# Patient Record
Sex: Male | Born: 1970 | Race: White | Hispanic: No | Marital: Married | Smoking: Never smoker
Health system: Southern US, Community
[De-identification: ages and names within clinical notes are randomized; demographics above are authoritative.]

## PROBLEM LIST (undated history)

## (undated) DIAGNOSIS — S86011A Strain of right Achilles tendon, initial encounter: Secondary | ICD-10-CM

## (undated) DIAGNOSIS — I10 Essential (primary) hypertension: Secondary | ICD-10-CM

## (undated) DIAGNOSIS — J302 Other seasonal allergic rhinitis: Secondary | ICD-10-CM

## (undated) HISTORY — PX: WISDOM TOOTH EXTRACTION: SHX21

---

## 2005-11-21 ENCOUNTER — Encounter: Admission: RE | Admit: 2005-11-21 | Discharge: 2005-11-21 | Payer: Self-pay | Admitting: Occupational Medicine

## 2011-02-05 ENCOUNTER — Other Ambulatory Visit (HOSPITAL_COMMUNITY): Payer: Self-pay | Admitting: Internal Medicine

## 2011-02-05 ENCOUNTER — Ambulatory Visit (HOSPITAL_COMMUNITY)
Admission: RE | Admit: 2011-02-05 | Discharge: 2011-02-05 | Disposition: A | Discharge status: Home / Self Care | Payer: BC Managed Care – PPO | Source: Ambulatory Visit | Attending: Internal Medicine | Admitting: Internal Medicine

## 2011-02-05 DIAGNOSIS — R1909 Other intra-abdominal and pelvic swelling, mass and lump: Secondary | ICD-10-CM

## 2011-02-05 DIAGNOSIS — N509 Disorder of male genital organs, unspecified: Secondary | ICD-10-CM | POA: Insufficient documentation

## 2011-02-05 DIAGNOSIS — N432 Other hydrocele: Secondary | ICD-10-CM | POA: Insufficient documentation

## 2011-02-19 ENCOUNTER — Ambulatory Visit (INDEPENDENT_AMBULATORY_CARE_PROVIDER_SITE_OTHER): Payer: BC Managed Care – PPO | Admitting: Urology

## 2011-02-19 DIAGNOSIS — N4340 Spermatocele of epididymis, unspecified: Secondary | ICD-10-CM

## 2011-03-05 ENCOUNTER — Ambulatory Visit (INDEPENDENT_AMBULATORY_CARE_PROVIDER_SITE_OTHER): Payer: BC Managed Care – PPO | Admitting: Urology

## 2011-03-05 DIAGNOSIS — N4340 Spermatocele of epididymis, unspecified: Secondary | ICD-10-CM

## 2013-07-21 ENCOUNTER — Other Ambulatory Visit (HOSPITAL_COMMUNITY): Payer: Self-pay | Admitting: Family Medicine

## 2013-07-21 ENCOUNTER — Ambulatory Visit (HOSPITAL_COMMUNITY)
Admission: RE | Admit: 2013-07-21 | Discharge: 2013-07-21 | Disposition: A | Discharge status: Home / Self Care | Payer: BC Managed Care – PPO | Source: Ambulatory Visit | Attending: Family Medicine | Admitting: Family Medicine

## 2013-07-21 DIAGNOSIS — R0989 Other specified symptoms and signs involving the circulatory and respiratory systems: Secondary | ICD-10-CM | POA: Insufficient documentation

## 2013-07-21 DIAGNOSIS — R059 Cough, unspecified: Secondary | ICD-10-CM | POA: Insufficient documentation

## 2013-07-21 DIAGNOSIS — R062 Wheezing: Secondary | ICD-10-CM | POA: Insufficient documentation

## 2013-07-21 DIAGNOSIS — R05 Cough: Secondary | ICD-10-CM

## 2014-09-22 ENCOUNTER — Encounter (HOSPITAL_BASED_OUTPATIENT_CLINIC_OR_DEPARTMENT_OTHER): Payer: Self-pay | Admitting: *Deleted

## 2014-09-22 NOTE — Progress Notes (Signed)
   09/22/14 1046  OBSTRUCTIVE SLEEP APNEA  Have you ever been diagnosed with sleep apnea through a sleep study? No  Do you snore loudly (loud enough to be heard through closed doors)?  1  Do you often feel tired, fatigued, or sleepy during the daytime? 0  Has anyone observed you stop breathing during your sleep? 0  Do you have, or are you being treated for high blood pressure? 1  BMI more than 35 kg/m2? 0  Age over 44 years old? 0  Neck circumference greater than 40 cm/16 inches? 1  Gender: 1  Obstructive Sleep Apnea Score 4  Score 4 or greater  Results sent to PCP

## 2014-09-22 NOTE — Progress Notes (Signed)
He was sent for stress test 7/15-ekg done-said it was fine-called for notes Will need istat-lives [place]

## 2014-09-27 ENCOUNTER — Ambulatory Visit (HOSPITAL_BASED_OUTPATIENT_CLINIC_OR_DEPARTMENT_OTHER): Payer: BLUE CROSS/BLUE SHIELD | Admitting: Anesthesiology

## 2014-09-27 ENCOUNTER — Encounter (HOSPITAL_BASED_OUTPATIENT_CLINIC_OR_DEPARTMENT_OTHER): Payer: Self-pay | Admitting: *Deleted

## 2014-09-27 ENCOUNTER — Ambulatory Visit (HOSPITAL_BASED_OUTPATIENT_CLINIC_OR_DEPARTMENT_OTHER)
Admission: RE | Admit: 2014-09-27 | Discharge: 2014-09-27 | Disposition: A | Discharge status: Home / Self Care | Payer: BLUE CROSS/BLUE SHIELD | Source: Ambulatory Visit | Attending: Orthopedic Surgery | Admitting: Orthopedic Surgery

## 2014-09-27 ENCOUNTER — Encounter (HOSPITAL_BASED_OUTPATIENT_CLINIC_OR_DEPARTMENT_OTHER)
Admission: RE | Disposition: A | Discharge status: Home / Self Care | Payer: Self-pay | Source: Ambulatory Visit | Attending: Orthopedic Surgery

## 2014-09-27 DIAGNOSIS — Y9367 Activity, basketball: Secondary | ICD-10-CM | POA: Insufficient documentation

## 2014-09-27 DIAGNOSIS — I10 Essential (primary) hypertension: Secondary | ICD-10-CM | POA: Insufficient documentation

## 2014-09-27 DIAGNOSIS — J302 Other seasonal allergic rhinitis: Secondary | ICD-10-CM | POA: Insufficient documentation

## 2014-09-27 DIAGNOSIS — S86011A Strain of right Achilles tendon, initial encounter: Secondary | ICD-10-CM | POA: Diagnosis present

## 2014-09-27 HISTORY — DX: Essential (primary) hypertension: I10

## 2014-09-27 HISTORY — DX: Strain of right Achilles tendon, initial encounter: S86.011A

## 2014-09-27 HISTORY — PX: ACHILLES TENDON SURGERY: SHX542

## 2014-09-27 HISTORY — DX: Other seasonal allergic rhinitis: J30.2

## 2014-09-27 LAB — POCT I-STAT, CHEM 8
BUN: 14 mg/dL (ref 6–23)
Calcium, Ion: 1.22 mmol/L (ref 1.12–1.23)
Chloride: 103 mEq/L (ref 96–112)
Creatinine, Ser: 0.8 mg/dL (ref 0.50–1.35)
Glucose, Bld: 122 mg/dL — ABNORMAL HIGH (ref 70–99)
HEMATOCRIT: 49 % (ref 39.0–52.0)
Hemoglobin: 16.7 g/dL (ref 13.0–17.0)
Potassium: 4 mmol/L (ref 3.5–5.1)
Sodium: 140 mmol/L (ref 135–145)
TCO2: 22 mmol/L (ref 0–100)

## 2014-09-27 SURGERY — REPAIR, TENDON, ACHILLES
Anesthesia: General | Laterality: Right

## 2014-09-27 MED ORDER — HYDROMORPHONE HCL 1 MG/ML IJ SOLN
0.2500 mg | INTRAMUSCULAR | Status: DC | PRN
  Administered 2014-09-27 (×2): 0.5 mg via INTRAVENOUS

## 2014-09-27 MED ORDER — OXYCODONE HCL 5 MG PO TABS
ORAL_TABLET | ORAL | Status: AC
  Filled 2014-09-27: qty 1

## 2014-09-27 MED ORDER — FENTANYL CITRATE 0.05 MG/ML IJ SOLN
INTRAMUSCULAR | Status: AC
  Filled 2014-09-27: qty 4

## 2014-09-27 MED ORDER — BUPIVACAINE HCL (PF) 0.5 % IJ SOLN
INTRAMUSCULAR | Status: AC
  Filled 2014-09-27: qty 30

## 2014-09-27 MED ORDER — BACLOFEN 10 MG PO TABS: 10.0000 mg | ORAL_TABLET | Freq: Three times a day (TID) | ORAL | Status: DC

## 2014-09-27 MED ORDER — SENNA-DOCUSATE SODIUM 8.6-50 MG PO TABS: 2.0000 | ORAL_TABLET | Freq: Every day | ORAL | Status: DC

## 2014-09-27 MED ORDER — ONDANSETRON HCL 4 MG/2ML IJ SOLN
INTRAMUSCULAR | Status: DC | PRN
  Administered 2014-09-27: 4 mg via INTRAVENOUS

## 2014-09-27 MED ORDER — BUPIVACAINE-EPINEPHRINE (PF) 0.5% -1:200000 IJ SOLN
INTRAMUSCULAR | Status: DC | PRN
  Administered 2014-09-27: 25 mL via PERINEURAL

## 2014-09-27 MED ORDER — FENTANYL CITRATE 0.05 MG/ML IJ SOLN
INTRAMUSCULAR | Status: AC
  Filled 2014-09-27: qty 2

## 2014-09-27 MED ORDER — LACTATED RINGERS IV SOLN
INTRAVENOUS | Status: DC
  Administered 2014-09-27 (×2): via INTRAVENOUS

## 2014-09-27 MED ORDER — ONDANSETRON HCL 4 MG PO TABS: 4.0000 mg | ORAL_TABLET | Freq: Three times a day (TID) | ORAL | Status: DC | PRN

## 2014-09-27 MED ORDER — HYDROMORPHONE HCL 1 MG/ML IJ SOLN
INTRAMUSCULAR | Status: AC
  Filled 2014-09-27: qty 1

## 2014-09-27 MED ORDER — ONDANSETRON HCL 4 MG/2ML IJ SOLN: 4.0000 mg | Freq: Once | INTRAMUSCULAR | Status: DC | PRN

## 2014-09-27 MED ORDER — SUCCINYLCHOLINE CHLORIDE 20 MG/ML IJ SOLN
INTRAMUSCULAR | Status: DC | PRN
  Administered 2014-09-27: 100 mg via INTRAVENOUS

## 2014-09-27 MED ORDER — FENTANYL CITRATE 0.05 MG/ML IJ SOLN
INTRAMUSCULAR | Status: DC | PRN
  Administered 2014-09-27 (×2): 50 ug via INTRAVENOUS

## 2014-09-27 MED ORDER — OXYCODONE HCL 5 MG PO TABS
5.0000 mg | ORAL_TABLET | Freq: Once | ORAL | Status: AC | PRN
  Administered 2014-09-27: 5 mg via ORAL

## 2014-09-27 MED ORDER — MIDAZOLAM HCL 2 MG/2ML IJ SOLN
1.0000 mg | INTRAMUSCULAR | Status: DC | PRN
  Administered 2014-09-27: 2 mg via INTRAVENOUS

## 2014-09-27 MED ORDER — MIDAZOLAM HCL 2 MG/2ML IJ SOLN
INTRAMUSCULAR | Status: AC
  Filled 2014-09-27: qty 2

## 2014-09-27 MED ORDER — CEFAZOLIN SODIUM-DEXTROSE 2-3 GM-% IV SOLR
INTRAVENOUS | Status: AC
  Filled 2014-09-27: qty 50

## 2014-09-27 MED ORDER — BUPIVACAINE-EPINEPHRINE (PF) 0.5% -1:200000 IJ SOLN
INTRAMUSCULAR | Status: AC
  Filled 2014-09-27: qty 30

## 2014-09-27 MED ORDER — DEXAMETHASONE SODIUM PHOSPHATE 4 MG/ML IJ SOLN
INTRAMUSCULAR | Status: DC | PRN
  Administered 2014-09-27: 10 mg via INTRAVENOUS

## 2014-09-27 MED ORDER — PROPOFOL 10 MG/ML IV BOLUS
INTRAVENOUS | Status: DC | PRN
  Administered 2014-09-27: 200 mg via INTRAVENOUS

## 2014-09-27 MED ORDER — OXYCODONE-ACETAMINOPHEN 10-325 MG PO TABS: 1.0000 | ORAL_TABLET | Freq: Four times a day (QID) | ORAL | Status: DC | PRN

## 2014-09-27 MED ORDER — CEFAZOLIN SODIUM-DEXTROSE 2-3 GM-% IV SOLR
2.0000 g | INTRAVENOUS | Status: AC
  Administered 2014-09-27: 2 g via INTRAVENOUS

## 2014-09-27 MED ORDER — FENTANYL CITRATE 0.05 MG/ML IJ SOLN
50.0000 ug | INTRAMUSCULAR | Status: DC | PRN
  Administered 2014-09-27: 100 ug via INTRAVENOUS

## 2014-09-27 MED ORDER — LIDOCAINE HCL (CARDIAC) 20 MG/ML IV SOLN
INTRAVENOUS | Status: DC | PRN
  Administered 2014-09-27: 50 mg via INTRAVENOUS

## 2014-09-27 MED ORDER — OXYCODONE HCL 5 MG/5ML PO SOLN: 5.0000 mg | Freq: Once | ORAL | Status: AC | PRN

## 2014-09-27 SURGICAL SUPPLY — 57 items
BANDAGE ELASTIC 4 VELCRO ST LF (GAUZE/BANDAGES/DRESSINGS) ×3 IMPLANT
BANDAGE ELASTIC 6 VELCRO ST LF (GAUZE/BANDAGES/DRESSINGS) ×3 IMPLANT
BANDAGE ESMARK 6X9 LF (GAUZE/BANDAGES/DRESSINGS) ×1 IMPLANT
BLADE SURG 15 STRL LF DISP TIS (BLADE) ×3 IMPLANT
BLADE SURG 15 STRL SS (BLADE) ×9
BNDG CMPR 9X6 STRL LF SNTH (GAUZE/BANDAGES/DRESSINGS) ×1
BNDG COHESIVE 4X5 TAN STRL (GAUZE/BANDAGES/DRESSINGS) ×3 IMPLANT
BNDG ESMARK 6X9 LF (GAUZE/BANDAGES/DRESSINGS) ×3
CANISTER SUCT 1200ML W/VALVE (MISCELLANEOUS) ×3 IMPLANT
CLOSURE STERI-STRIP 1/2X4 (GAUZE/BANDAGES/DRESSINGS) ×1
CLSR STERI-STRIP ANTIMIC 1/2X4 (GAUZE/BANDAGES/DRESSINGS) ×1 IMPLANT
COVER BACK TABLE 60X90IN (DRAPES) ×3 IMPLANT
CUFF TOURNIQUET SINGLE 34IN LL (TOURNIQUET CUFF) ×2 IMPLANT
DECANTER SPIKE VIAL GLASS SM (MISCELLANEOUS) IMPLANT
DRAPE EXTREMITY T 121X128X90 (DRAPE) ×3 IMPLANT
DRAPE U 20/CS (DRAPES) ×3 IMPLANT
DRAPE U-SHAPE 47X51 STRL (DRAPES) ×3 IMPLANT
DRSG PAD ABDOMINAL 8X10 ST (GAUZE/BANDAGES/DRESSINGS) ×3 IMPLANT
DURAPREP 26ML APPLICATOR (WOUND CARE) ×5 IMPLANT
ELECT REM PT RETURN 9FT ADLT (ELECTROSURGICAL) ×3
ELECTRODE REM PT RTRN 9FT ADLT (ELECTROSURGICAL) ×1 IMPLANT
GAUZE SPONGE 4X4 12PLY STRL (GAUZE/BANDAGES/DRESSINGS) ×3 IMPLANT
GLOVE BIO SURGEON STRL SZ7.5 (GLOVE) ×2 IMPLANT
GLOVE BIO SURGEON STRL SZ8.5 (GLOVE) ×2 IMPLANT
GLOVE BIOGEL PI IND STRL 8 (GLOVE) ×2 IMPLANT
GLOVE BIOGEL PI INDICATOR 8 (GLOVE) ×4
GLOVE ORTHO TXT STRL SZ7.5 (GLOVE) ×3 IMPLANT
GLOVE SURG ORTHO 8.0 STRL STRW (GLOVE) ×3 IMPLANT
GOWN STRL REUS W/ TWL LRG LVL3 (GOWN DISPOSABLE) ×1 IMPLANT
GOWN STRL REUS W/ TWL XL LVL3 (GOWN DISPOSABLE) ×2 IMPLANT
GOWN STRL REUS W/TWL LRG LVL3 (GOWN DISPOSABLE) ×3
GOWN STRL REUS W/TWL XL LVL3 (GOWN DISPOSABLE) ×6
NDL HYPO 25X1 1.5 SAFETY (NEEDLE) IMPLANT
NEEDLE HYPO 25X1 1.5 SAFETY (NEEDLE) IMPLANT
NS IRRIG 1000ML POUR BTL (IV SOLUTION) ×3 IMPLANT
PACK BASIN DAY SURGERY FS (CUSTOM PROCEDURE TRAY) ×3 IMPLANT
PAD CAST 4YDX4 CTTN HI CHSV (CAST SUPPLIES) ×2 IMPLANT
PADDING CAST COTTON 4X4 STRL (CAST SUPPLIES) ×6
PENCIL BUTTON HOLSTER BLD 10FT (ELECTRODE) ×3 IMPLANT
SLEEVE SCD COMPRESS KNEE MED (MISCELLANEOUS) ×4 IMPLANT
SPLINT FAST PLASTER 5X30 (CAST SUPPLIES) ×40
SPLINT PLASTER CAST FAST 5X30 (CAST SUPPLIES) IMPLANT
SPONGE LAP 4X18 X RAY DECT (DISPOSABLE) ×3 IMPLANT
STOCKINETTE 6  STRL (DRAPES) ×2
STOCKINETTE 6 STRL (DRAPES) ×1 IMPLANT
SUCTION FRAZIER TIP 10 FR DISP (SUCTIONS) ×3 IMPLANT
SUT FIBERWIRE #2 38 T-5 BLUE (SUTURE) ×12
SUT MNCRL AB 4-0 PS2 18 (SUTURE) IMPLANT
SUT VICRYL 3-0 CR8 SH (SUTURE) ×3 IMPLANT
SUTURE FIBERWR #2 38 T-5 BLUE (SUTURE) IMPLANT
SYR BULB 3OZ (MISCELLANEOUS) ×3 IMPLANT
SYR CONTROL 10ML LL (SYRINGE) IMPLANT
TOWEL OR NON WOVEN STRL DISP B (DISPOSABLE) ×3 IMPLANT
TUBE CONNECTING 20'X1/4 (TUBING)
TUBE CONNECTING 20X1/4 (TUBING) IMPLANT
UNDERPAD 30X30 INCONTINENT (UNDERPADS AND DIAPERS) ×3 IMPLANT
YANKAUER SUCT BULB TIP NO VENT (SUCTIONS) IMPLANT

## 2014-09-27 NOTE — Anesthesia Preprocedure Evaluation (Signed)
Anesthesia Evaluation  Patient identified by MRN, date of birth, ID band Patient awake    Reviewed: Allergy & Precautions, NPO status , Patient's Chart, lab work & pertinent test results  Airway Mallampati: I  TM Distance: >3 FB Neck ROM: Full    Dental  (+) Teeth Intact, Dental Advisory Given   Pulmonary  breath sounds clear to auscultation        Cardiovascular hypertension, Pt. on medications Rhythm:Regular Rate:Normal     Neuro/Psych    GI/Hepatic   Endo/Other    Renal/GU      Musculoskeletal   Abdominal   Peds  Hematology   Anesthesia Other Findings   Reproductive/Obstetrics                             Anesthesia Physical Anesthesia Plan  ASA: II  Anesthesia Plan: General   Post-op Pain Management:    Induction: Intravenous  Airway Management Planned: Oral ETT  Additional Equipment:   Intra-op Plan:   Post-operative Plan: Extubation in OR  Informed Consent: I have reviewed the patients History and Physical, chart, labs and discussed the procedure including the risks, benefits and alternatives for the proposed anesthesia with the patient or authorized representative who has indicated his/her understanding and acceptance.   Dental advisory given  Plan Discussed with: CRNA, Anesthesiologist and Surgeon  Anesthesia Plan Comments:         Anesthesia Quick Evaluation  

## 2014-09-27 NOTE — Transfer of Care (Signed)
Immediate Anesthesia Transfer of Care Note  Patient: Kurt Fowler  Procedure(s) Performed: Procedure(s): RIGHT ACHILLES TENDON REPAIR (Right)  Patient Location: PACU  Anesthesia Type:General and GA combined with regional for post-op pain  Level of Consciousness: awake and alert   Airway & Oxygen Therapy: Patient Spontanous Breathing and Patient connected to face mask oxygen  Post-op Assessment: Report given to PACU RN and Post -op Vital signs reviewed and stable  Post vital signs: Reviewed and stable  Complications: No apparent anesthesia complications

## 2014-09-27 NOTE — Anesthesia Procedure Notes (Addendum)
Anesthesia Regional Block:  Popliteal block  Pre-Anesthetic Checklist: ,, timeout performed, Correct Patient, Correct Site, Correct Laterality, Correct Procedure, Correct Position, site marked, Risks and benefits discussed,  Surgical consent,  Pre-op evaluation,  At surgeon's request and post-op pain management  Laterality: Right and Lower  Prep: chloraprep       Needles:  Injection technique: Single-shot  Needle Type: Echogenic Needle     Needle Length: 9cm 9 cm Needle Gauge: 21 and 21 G    Additional Needles:  Procedures: ultrasound guided (picture in chart) Popliteal block Narrative:  Start time: 09/27/2014 10:27 AM End time: 09/27/2014 10:33 AM Injection made incrementally with aspirations every 5 mL.  Performed by: Personally  Anesthesiologist: CREWS, DAVID A   Procedure Name: Intubation Date/Time: 09/27/2014 11:10 AM Performed by: Caren MacadamARTER, Nikodem Leadbetter W Pre-anesthesia Checklist: Patient identified, Emergency Drugs available, Suction available and Patient being monitored Patient Re-evaluated:Patient Re-evaluated prior to inductionOxygen Delivery Method: Circle System Utilized Preoxygenation: Pre-oxygenation with 100% oxygen Intubation Type: IV induction Ventilation: Mask ventilation without difficulty Laryngoscope Size: Miller and 2 Grade View: Grade I Tube type: Oral Tube size: 8.0 mm Number of attempts: 1 Airway Equipment and Method: stylet and oral airway Placement Confirmation: ETT inserted through vocal cords under direct vision,  positive ETCO2 and breath sounds checked- equal and bilateral Secured at: 22 cm Tube secured with: Tape Dental Injury: Teeth and Oropharynx as per pre-operative assessment

## 2014-09-27 NOTE — H&P (Signed)
PREOPERATIVE H&P  Chief Complaint: right achilles rupture  HPI: Kurt Fowler is a 44 y.o. male who presents for preoperative history and physical with a diagnosis of right achilles rupture. Symptoms are rated as moderate to severe, and have been worsening.  This is significantly impairing activities of daily living.  He has elected for surgical management. This occurred December 23 while playing basketball and he has 6/10 pain with difficulty walking.  Past Medical History  Diagnosis Date  . Hypertension   . Seasonal allergies    Past Surgical History  Procedure Laterality Date  . Wisdom tooth extraction     History   Social History  . Marital Status: Married    Spouse Name: N/A    Number of Children: N/A  . Years of Education: N/A   Social History Main Topics  . Smoking status: Never Smoker   . Smokeless tobacco: Current User    Types: Snuff  . Alcohol Use: Yes     Comment: daily 2-3 beers  . Drug Use: No  . Sexual Activity: None     Comment: occ cigar   Other Topics Concern  . None   Social History Narrative  . None   History reviewed. No pertinent family history. No Known Allergies Prior to Admission medications   Medication Sig Start Date End Date Taking? Authorizing Provider  cetirizine (ZYRTEC) 10 MG tablet Take 10 mg by mouth as needed for allergies.   Yes Historical Provider, MD  HYDROcodone-acetaminophen (NORCO/VICODIN) 5-325 MG per tablet Take 1 tablet by mouth every 6 (six) hours as needed for moderate pain.   Yes Historical Provider, MD  losartan (COZAAR) 25 MG tablet Take 25 mg by mouth daily.   Yes Historical Provider, MD  Omega-3 Fatty Acids (FISH OIL) 1000 MG CAPS Take by mouth.   Yes Historical Provider, MD     Positive ROS: All other systems have been reviewed and were otherwise negative with the exception of those mentioned in the HPI and as above.  Physical Exam: General: Alert, no acute distress Cardiovascular: No pedal edema Respiratory:  No cyanosis, no use of accessory musculature GI: No organomegaly, abdomen is soft and non-tender Skin: No lesions in the area of chief complaint Neurologic: Sensation intact distally Psychiatric: Patient is competent for consent with normal mood and affect Lymphatic: No axillary or cervical lymphadenopathy  MUSCULOSKELETAL: Right leg has a positive Thompson test with ecchymosis and swelling diffusely around the Achilles. He has a palpable gap.  Assessment: right achilles rupture  Plan: Plan for Procedure(s): RIGHT ACHILLES TENDON REPAIR  The risks benefits and alternatives were discussed with the patient including but not limited to the risks of nonoperative treatment, versus surgical intervention including infection, bleeding, nerve injury,  blood clots, cardiopulmonary complications, morbidity, mortality, among others, and they were willing to proceed. We've also discussed the risk for skin breakdown, persistent weakness, and recurrent rupture.  Eulas PostLANDAU,Wrenley Sayed P, MD Cell [phone]   09/27/2014 7:10 AM

## 2014-09-27 NOTE — Discharge Instructions (Signed)
Diet: As you were doing prior to hospitalization   Shower:  May shower but keep the wounds dry, use an occlusive plastic wrap, NO SOAKING IN TUB.  If the bandage gets wet, change with a clean dry gauze.  Dressing:  Keep splint clean and dry.   There are sticky tapes (steri-strips) on your wounds and all the stitches are absorbable.  Leave the steri-strips in place when changing your dressings, they will peel off with time, usually 2-3 weeks.  Activity:  Increase activity slowly as tolerated, but follow the weight bearing instructions below.  No lifting or driving for 6 weeks.  Weight Bearing:   Non-weight bearing.    To prevent constipation: you may use a stool softener such as -  Colace (over the counter) 100 mg by mouth twice a day  Drink plenty of fluids (prune juice may be helpful) and high fiber foods Miralax (over the counter) for constipation as needed.    Itching:  If you experience itching with your medications, try taking only a single pain pill, or even half a pain pill at a time.  You may take up to 10 pain pills per day, and you can also use benadryl over the counter for itching or also to help with sleep.   Precautions:  If you experience chest pain or shortness of breath - call 911 immediately for transfer to the hospital emergency department!!  If you develop a fever greater that 101 F, purulent drainage from wound, increased redness or drainage from wound, or calf pain -- Call the office at [phone]                                                Follow- Up Appointment:  Please call for an appointment to be seen in 2 weeks [place] - [phone]   Regional Anesthesia Blocks  1. Numbness or the inability to move the "blocked" extremity may last from 3-48 hours after placement. The length of time depends on the medication injected and your individual response to the medication. If the numbness is not going away after 48 hours, call your surgeon.  2. The extremity  that is blocked will need to be protected until the numbness is gone and the  Strength has returned. Because you cannot feel it, you will need to take extra care to avoid injury. Because it may be weak, you may have difficulty moving it or using it. You may not know what position it is in without looking at it while the block is in effect.  3. For blocks in the legs and feet, returning to weight bearing and walking needs to be done carefully. You will need to wait until the numbness is entirely gone and the strength has returned. You should be able to move your leg and foot normally before you try and bear weight or walk. You will need someone to be with you when you first try to ensure you do not fall and possibly risk injury.  4. Bruising and tenderness at the needle site are common side effects and will resolve in a few days.  5. Persistent numbness or new problems with movement should be communicated to the surgeon or the [facility] [phone] [facility] [phone]).   Post Anesthesia Home Care Instructions  Activity: Get plenty of rest for the remainder  of the day. A responsible adult should stay with you for 24 hours following the procedure.  For the next 24 hours, DO NOT: -Drive a car -Advertising copywriterperate machinery -Drink alcoholic beverages -Take any medication unless instructed by your physician -Make any legal decisions or sign important papers.  Meals: Start with liquid foods such as gelatin or soup. Progress to regular foods as tolerated. Avoid greasy, spicy, heavy foods. If nausea and/or vomiting occur, drink only clear liquids until the nausea and/or vomiting subsides. Call your physician if vomiting continues.  Special Instructions/Symptoms: Your throat may feel dry or sore from the anesthesia or the breathing tube placed in your throat during surgery. If this causes discomfort, gargle with warm salt water. The discomfort should disappear within 24  hours.

## 2014-09-27 NOTE — Progress Notes (Signed)
Assisted Dr. Crews with right, ultrasound guided, popliteal block. Side rails up, monitors on throughout procedure. See vital signs in flow sheet. Tolerated Procedure well. 

## 2014-09-27 NOTE — Op Note (Signed)
09/27/2014  11:58 AM  PATIENT:  Kurt Fowler    PRE-OPERATIVE DIAGNOSIS:  right achilles rupture  POST-OPERATIVE DIAGNOSIS:  Same  PROCEDURE:  RIGHT ACHILLES TENDON REPAIR  SURGEON:  Eulas PostLANDAU,Alaiza Yau P, MD  PHYSICIAN ASSISTANT: Janace LittenBrandon Parry, OPA-C, present and scrubbed throughout the case, critical for completion in a timely fashion, and for retraction, instrumentation, and closure.  ANESTHESIA:   General  PREOPERATIVE INDICATIONS:  Kurt Fowler is a  44 y.o. male with a diagnosis of right achilles rupture who elected for surgical management.    The risks benefits and alternatives were discussed with the patient preoperatively including but not limited to the option of closed treatment, the risks of infection, bleeding, nerve injury, cardiopulmonary complications, the need for revision surgery, repeat rupture, stiffness, skin breakdown with wound healing complications, among others, and the patient was willing to proceed.   OPERATIVE IMPLANTS: #2 FiberWire x4 crossing strands.  OPERATIVE FINDINGS: The Achilles tendon was completely torn. He had an abnormal Thompson test.  OPERATIVE PROCEDURE: The patient was brought to the operating room and placed in supine position. Antibiotics were given. General anesthesia was administered. He was turned prone and the lower extremity was prepped and draped in the usual sterile fashion. The leg was elevated and exsanguinated and the tourniquet was inflated. A posterior medial incision was performed, taking care to protect the sural nerve. The epitendinous tissue was dissected away using sharp dissection in order to minimize soft tissue trauma. The peritenon was incised sharply and then the superficial layer dissected away. The tendon itself was shredded, and I cleaned the tendinous edges, and prepared them for repair.  I placed a total of 2 #2 FiberWire in a running interrupted Krakw type fashion up and down the tendon proximally on the posterior and  anterior aspect, and then performed parallel repair on the distal segment of tendon. I then tied the anterior lateral proximal sutures to the anterior lateral distal suture, and then the anterior medial proximal suture to the anterior medial distal suture. I then tied the corresponding posterior sutures together. Excellent tendinous apposition was achieved.   I irrigated the wounds copiously and repaired the peritenon with 2-0 Vicryl proximally.  The subcutaneous tissues closed with Vicryl with routine closure with Steri-Strips and sterile gauze for the skin. A posterior splint was applied with the foot in slight plantarflexion. The tourniquet was released.  The patient was awakened and returned to the PACU in stable and satisfactory condition. There no complications and He tolerated the procedure well.

## 2014-09-27 NOTE — Anesthesia Postprocedure Evaluation (Signed)
  Anesthesia Post-op Note  Patient: Kurt Fowler  Procedure(s) Performed: Procedure(s): RIGHT ACHILLES TENDON REPAIR (Right)  Patient Location: PACU  Anesthesia Type: General   Level of Consciousness: awake, alert  and oriented  Airway and Oxygen Therapy: Patient Spontanous Breathing  Post-op Pain: mild  Post-op Assessment: Post-op Vital signs reviewed  Post-op Vital Signs: Reviewed  Last Vitals:  Filed Vitals:   09/27/14 1300  BP:   Pulse: 73  Temp:   Resp: 15    Complications: No apparent anesthesia complications

## 2014-09-28 ENCOUNTER — Encounter (HOSPITAL_BASED_OUTPATIENT_CLINIC_OR_DEPARTMENT_OTHER): Payer: Self-pay | Admitting: Orthopedic Surgery

## 2019-10-28 DIAGNOSIS — R0681 Apnea, not elsewhere classified: Secondary | ICD-10-CM | POA: Diagnosis not present

## 2019-10-28 DIAGNOSIS — E119 Type 2 diabetes mellitus without complications: Secondary | ICD-10-CM | POA: Diagnosis not present

## 2019-10-28 DIAGNOSIS — I10 Essential (primary) hypertension: Secondary | ICD-10-CM | POA: Diagnosis not present

## 2019-10-28 DIAGNOSIS — Z6830 Body mass index (BMI) 30.0-30.9, adult: Secondary | ICD-10-CM | POA: Diagnosis not present

## 2019-10-28 DIAGNOSIS — Z13220 Encounter for screening for lipoid disorders: Secondary | ICD-10-CM | POA: Diagnosis not present

## 2019-10-28 DIAGNOSIS — Z1389 Encounter for screening for other disorder: Secondary | ICD-10-CM | POA: Diagnosis not present

## 2021-07-27 ENCOUNTER — Emergency Department (HOSPITAL_COMMUNITY)
Admission: EM | Admit: 2021-07-27 | Discharge: 2021-07-27 | Disposition: A | Discharge status: Home / Self Care | Payer: 59 | Attending: Emergency Medicine | Admitting: Emergency Medicine

## 2021-07-27 ENCOUNTER — Other Ambulatory Visit: Payer: Self-pay

## 2021-07-27 ENCOUNTER — Encounter (HOSPITAL_COMMUNITY): Payer: Self-pay

## 2021-07-27 ENCOUNTER — Emergency Department (HOSPITAL_COMMUNITY): Payer: 59

## 2021-07-27 DIAGNOSIS — F17290 Nicotine dependence, other tobacco product, uncomplicated: Secondary | ICD-10-CM | POA: Insufficient documentation

## 2021-07-27 DIAGNOSIS — Z79899 Other long term (current) drug therapy: Secondary | ICD-10-CM | POA: Insufficient documentation

## 2021-07-27 DIAGNOSIS — I10 Essential (primary) hypertension: Secondary | ICD-10-CM | POA: Diagnosis not present

## 2021-07-27 DIAGNOSIS — I160 Hypertensive urgency: Secondary | ICD-10-CM

## 2021-07-27 DIAGNOSIS — Z7982 Long term (current) use of aspirin: Secondary | ICD-10-CM | POA: Diagnosis not present

## 2021-07-27 LAB — CBC WITH DIFFERENTIAL/PLATELET
Abs Immature Granulocytes: 0.02 10*3/uL (ref 0.00–0.07)
Basophils Absolute: 0 10*3/uL (ref 0.0–0.1)
Basophils Relative: 1 %
Eosinophils Absolute: 0.2 10*3/uL (ref 0.0–0.5)
Eosinophils Relative: 3 %
HCT: 44.9 % (ref 39.0–52.0)
Hemoglobin: 15.1 g/dL (ref 13.0–17.0)
Immature Granulocytes: 0 %
Lymphocytes Relative: 38 %
Lymphs Abs: 2.3 10*3/uL (ref 0.7–4.0)
MCH: 30.8 pg (ref 26.0–34.0)
MCHC: 33.6 g/dL (ref 30.0–36.0)
MCV: 91.6 fL (ref 80.0–100.0)
Monocytes Absolute: 0.7 10*3/uL (ref 0.1–1.0)
Monocytes Relative: 12 %
Neutro Abs: 2.8 10*3/uL (ref 1.7–7.7)
Neutrophils Relative %: 46 %
Platelets: 236 10*3/uL (ref 150–400)
RBC: 4.9 MIL/uL (ref 4.22–5.81)
RDW: 12.6 % (ref 11.5–15.5)
WBC: 6.1 10*3/uL (ref 4.0–10.5)
nRBC: 0 % (ref 0.0–0.2)

## 2021-07-27 LAB — COMPREHENSIVE METABOLIC PANEL
ALT: 47 U/L — ABNORMAL HIGH (ref 0–44)
AST: 30 U/L (ref 15–41)
Albumin: 4.3 g/dL (ref 3.5–5.0)
Alkaline Phosphatase: 50 U/L (ref 38–126)
Anion gap: 6 (ref 5–15)
BUN: 9 mg/dL (ref 6–20)
CO2: 30 mmol/L (ref 22–32)
Calcium: 8.7 mg/dL — ABNORMAL LOW (ref 8.9–10.3)
Chloride: 102 mmol/L (ref 98–111)
Creatinine, Ser: 0.76 mg/dL (ref 0.61–1.24)
GFR, Estimated: >60 mL/min (ref >60)
Glucose, Bld: 138 mg/dL — ABNORMAL HIGH (ref 70–99)
Potassium: 3.8 mmol/L (ref 3.5–5.1)
Sodium: 138 mmol/L (ref 135–145)
Total Bilirubin: 0.5 mg/dL (ref 0.3–1.2)
Total Protein: 7.4 g/dL (ref 6.5–8.1)

## 2021-07-27 LAB — TROPONIN I (HIGH SENSITIVITY)
Troponin I (High Sensitivity): 6 ng/L (ref <18)
Troponin I (High Sensitivity): 5 ng/L (ref <18)

## 2021-07-27 MED ORDER — LOSARTAN POTASSIUM 50 MG PO TABS: 50.0000 mg | ORAL_TABLET | Freq: Every day | ORAL | 1 refills | Status: DC

## 2021-07-27 MED ORDER — LOSARTAN POTASSIUM 25 MG PO TABS
25.0000 mg | ORAL_TABLET | Freq: Once | ORAL | Status: AC
  Administered 2021-07-27: 25 mg via ORAL
  Filled 2021-07-27: qty 1

## 2021-07-27 NOTE — ED Provider Notes (Signed)
[facility] EMERGENCY DEPARTMENT Provider Note   CSN: [id number] Arrival date & time: 07/27/21  1525     History Chief Complaint  Patient presents with   Hypertension    Kurt Fowler is a 51 y.o. male with longstanding hypertensive history who presents to the emergency department for further evaluation of jaw pain and elevated blood pressures that began over the weekend.  Patient states that he was battling a cold and was taking over-the-counter decongestant medicine and cold and flu medicine.  He had systolic blood pressures over 200 at that time.  He woke up this morning with some right-sided jaw pain which prompted his arrival to the emergency department today.  He denies any chest pain, shortness of breath, focal weakness/numbness, abdominal pain, nausea, vomiting, diarrhea, hematuria, fever or chills.  He typically takes his losartan at night and has not taken a dose today.  He does have a significant family history of coronary artery disease and myocardial infarction.   Hypertension      Past Medical History:  Diagnosis Date   Hypertension    Rupture of right Achilles tendon 09/27/2014   Seasonal allergies     Patient Active Problem List   Diagnosis Date Noted   Rupture of right Achilles tendon 09/27/2014    Past Surgical History:  Procedure Laterality Date   ACHILLES TENDON SURGERY Right 09/27/2014   Procedure: RIGHT ACHILLES TENDON REPAIR;  Surgeon: Johnny Bridge, MD;  Location: [place];  Service: Orthopedics;  Laterality: Right;   WISDOM TOOTH EXTRACTION         History reviewed. No pertinent family history.  Social History   Tobacco Use   Smoking status: Never   Smokeless tobacco: Current    Types: Snuff  Substance Use Topics   Alcohol use: Yes    Comment: daily 2-3 beers   Drug use: No    Home Medications Prior to Admission medications   Medication Sig Start Date End Date Taking? Authorizing Provider  aspirin 81 MG tablet Take 81  mg by mouth daily.   Yes [provider]  baclofen (LIORESAL) 10 MG tablet Take 1 tablet (10 mg total) by mouth 3 (three) times daily. As needed for muscle spasm Patient not taking: No sig reported 09/27/14   Marchia Bond, MD  cetirizine (ZYRTEC) 10 MG tablet Take 10 mg by mouth as needed for allergies. Patient not taking: Reported on 07/27/2021    [provider]  losartan (COZAAR) 25 MG tablet Take 25 mg by mouth daily. Patient not taking: Reported on 07/27/2021    [provider]  olmesartan (BENICAR) 40 MG tablet Take 40 mg by mouth daily. Patient not taking: Reported on 07/27/2021 07/12/21   [provider]  Omega-3 Fatty Acids (FISH OIL) 1000 MG CAPS Take by mouth. Patient not taking: Reported on 07/27/2021    [provider]  ondansetron (ZOFRAN) 4 MG tablet Take 1 tablet (4 mg total) by mouth every 8 (eight) hours as needed for nausea or vomiting. Patient not taking: Reported on 07/27/2021 09/27/14   Marchia Bond, MD  oxyCODONE-acetaminophen (PERCOCET) 10-325 MG per tablet Take 1-2 tablets by mouth every 6 (six) hours as needed for pain. MAXIMUM TOTAL ACETAMINOPHEN DOSE IS 4000 MG PER DAY Patient not taking: Reported on 07/27/2021 09/27/14   Marchia Bond, MD  sennosides-docusate sodium (SENOKOT-S) 8.6-50 MG tablet Take 2 tablets by mouth daily. Patient not taking: Reported on 07/27/2021 09/27/14   Marchia Bond, MD    Allergies  Patient has no known allergies.  Review of Systems   Review of Systems  All other systems reviewed and are negative.  Physical Exam Updated Vital Signs BP (!) 184/105   Pulse (!) 50   Temp 98 F (36.7 C) (Oral)   Resp 15   Ht 6\' 2"  (1.88 m)   Wt 104.3 kg   SpO2 99%   BMI 29.53 kg/m   Physical Exam Vitals and nursing note reviewed.  Constitutional:      General: He is not in acute distress.    Appearance: Normal appearance.  HENT:     Head: Normocephalic and atraumatic.  Eyes:     General:         Right eye: No discharge.        Left eye: No discharge.  Cardiovascular:     Comments: Regular rate and rhythm.  S1/S2 are distinct without any evidence of murmur, rubs, or gallops.  Radial pulses are 2+ bilaterally.  Dorsalis pedis pulses are 2+ bilaterally.  No evidence of pedal edema. Pulmonary:     Comments: Clear to auscultation bilaterally.  Normal effort.  No respiratory distress.  No evidence of wheezes, rales, or rhonchi heard throughout. Abdominal:     General: Abdomen is flat. Bowel sounds are normal. There is no distension.     Tenderness: There is no abdominal tenderness. There is no guarding or rebound.  Musculoskeletal:        General: Normal range of motion.     Cervical back: Neck supple.  Skin:    General: Skin is warm and dry.     Findings: No rash.  Neurological:     General: No focal deficit present.     Mental Status: He is alert.     Comments: 5/5 strength in the upper and lower extremities.  Cranial nerves II through XII are intact.  Normal sensation to the upper and lower extremities.  Psychiatric:        Mood and Affect: Mood normal.        Behavior: Behavior normal.    ED Results / Procedures / Treatments   Labs (all labs ordered are listed, but only abnormal results are displayed) Labs Reviewed  COMPREHENSIVE METABOLIC PANEL - Abnormal; Notable for the following components:      Result Value   Glucose, Bld 138 (*)    Calcium 8.7 (*)    ALT 47 (*)    All other components within normal limits  CBC WITH DIFFERENTIAL/PLATELET  TROPONIN I (HIGH SENSITIVITY)  TROPONIN I (HIGH SENSITIVITY)    EKG EKG Interpretation  Date/Time:  Friday July 27 2021 16:03:05 EDT Ventricular Rate:  59 PR Interval:  172 QRS Duration: 113 QT Interval:  430 QTC Calculation: 426 R Axis:   66 Text Interpretation: Sinus rhythm Incomplete right bundle branch block Confirmed by [id number] (Eber Hong) on 07/27/2021 4:51:06 PM  Radiology DG Chest 2 View  Result Date:  07/27/2021 CLINICAL DATA:  Chest pain. EXAM: CHEST - 2 VIEW COMPARISON:  07/21/2013 FINDINGS: Normal heart, mediastinum and hila. Clear lungs.  No pleural effusion or pneumothorax. Skeletal structures are unremarkable. IMPRESSION: No active cardiopulmonary disease. Electronically Signed   By: 07/23/2013 M.D.   On: 07/27/2021 16:44    Procedures Procedures   Medications Ordered in ED Medications  losartan (COZAAR) tablet 25 mg (25 mg Oral Given 07/27/21 1646)    ED Course  I have reviewed the triage vital signs and the nursing notes.  Pertinent labs &  imaging results that were available during my care of the patient were reviewed by me and considered in my medical decision making (see chart for details).    MDM Rules/Calculators/A&P                          Kurt Fowler is a 50 y.o. male who presents the emergency department for further evaluation of hypertensive urgency.  Given the patient's normal neurological exam I have a low suspicion for CVA at this time.  I also have a low suspicion for intracranial hemorrhage.  Initial troponin was negative.  CBC and CMP were unremarkable.  No signs of kidney damage or liver damage.  Delta troponin is pending.  If this comes back normal patient will likely be able to go home.  Strict return precautions are given.  I will have him follow-up with his primary care doctor as he likely needs adjustment in his hypertension medication regimen.  The rest of his care was transferred to Dr. Sabra Heck my attending.   Final Clinical Impression(s) / ED Diagnoses Final diagnoses:  Hypertensive urgency    Rx / DC Orders ED Discharge Orders     None        Cherrie Gauze 07/27/21 1903    Noemi Chapel, MD 07/27/21 2024

## 2021-07-27 NOTE — Discharge Instructions (Signed)
You were seen evaluated in the emergency department for elevated blood pressures.  Your work-up today did not reveal any signs of heart damage, kidney damage, or liver damage.  Would like for you to follow-up with your primary care doctor as your blood pressure medications may need to be adjusted.  Please return to the emergency department if you experience worsening blood pressure symptoms including chest pain, shortness of breath, weakness/numbness in arm or leg, loss of consciousness, or any other concerns you might have.

## 2021-07-27 NOTE — ED Triage Notes (Signed)
Patient reports HTN that started earlier this week. States that he had jaw pain on Tuesday. Reports URI symptoms this past week and taking OTC meds for cold.

## 2021-07-27 NOTE — ED Notes (Signed)
Patient transported to X-ray 

## 2022-03-12 IMAGING — DX DG CHEST 2V
2 series · 2 of 2 positions shown · non-contrast
Comparison: 07/21/2013

CLINICAL DATA: Chest pain.

EXAM:
CHEST - 2 VIEW

[chest pa]
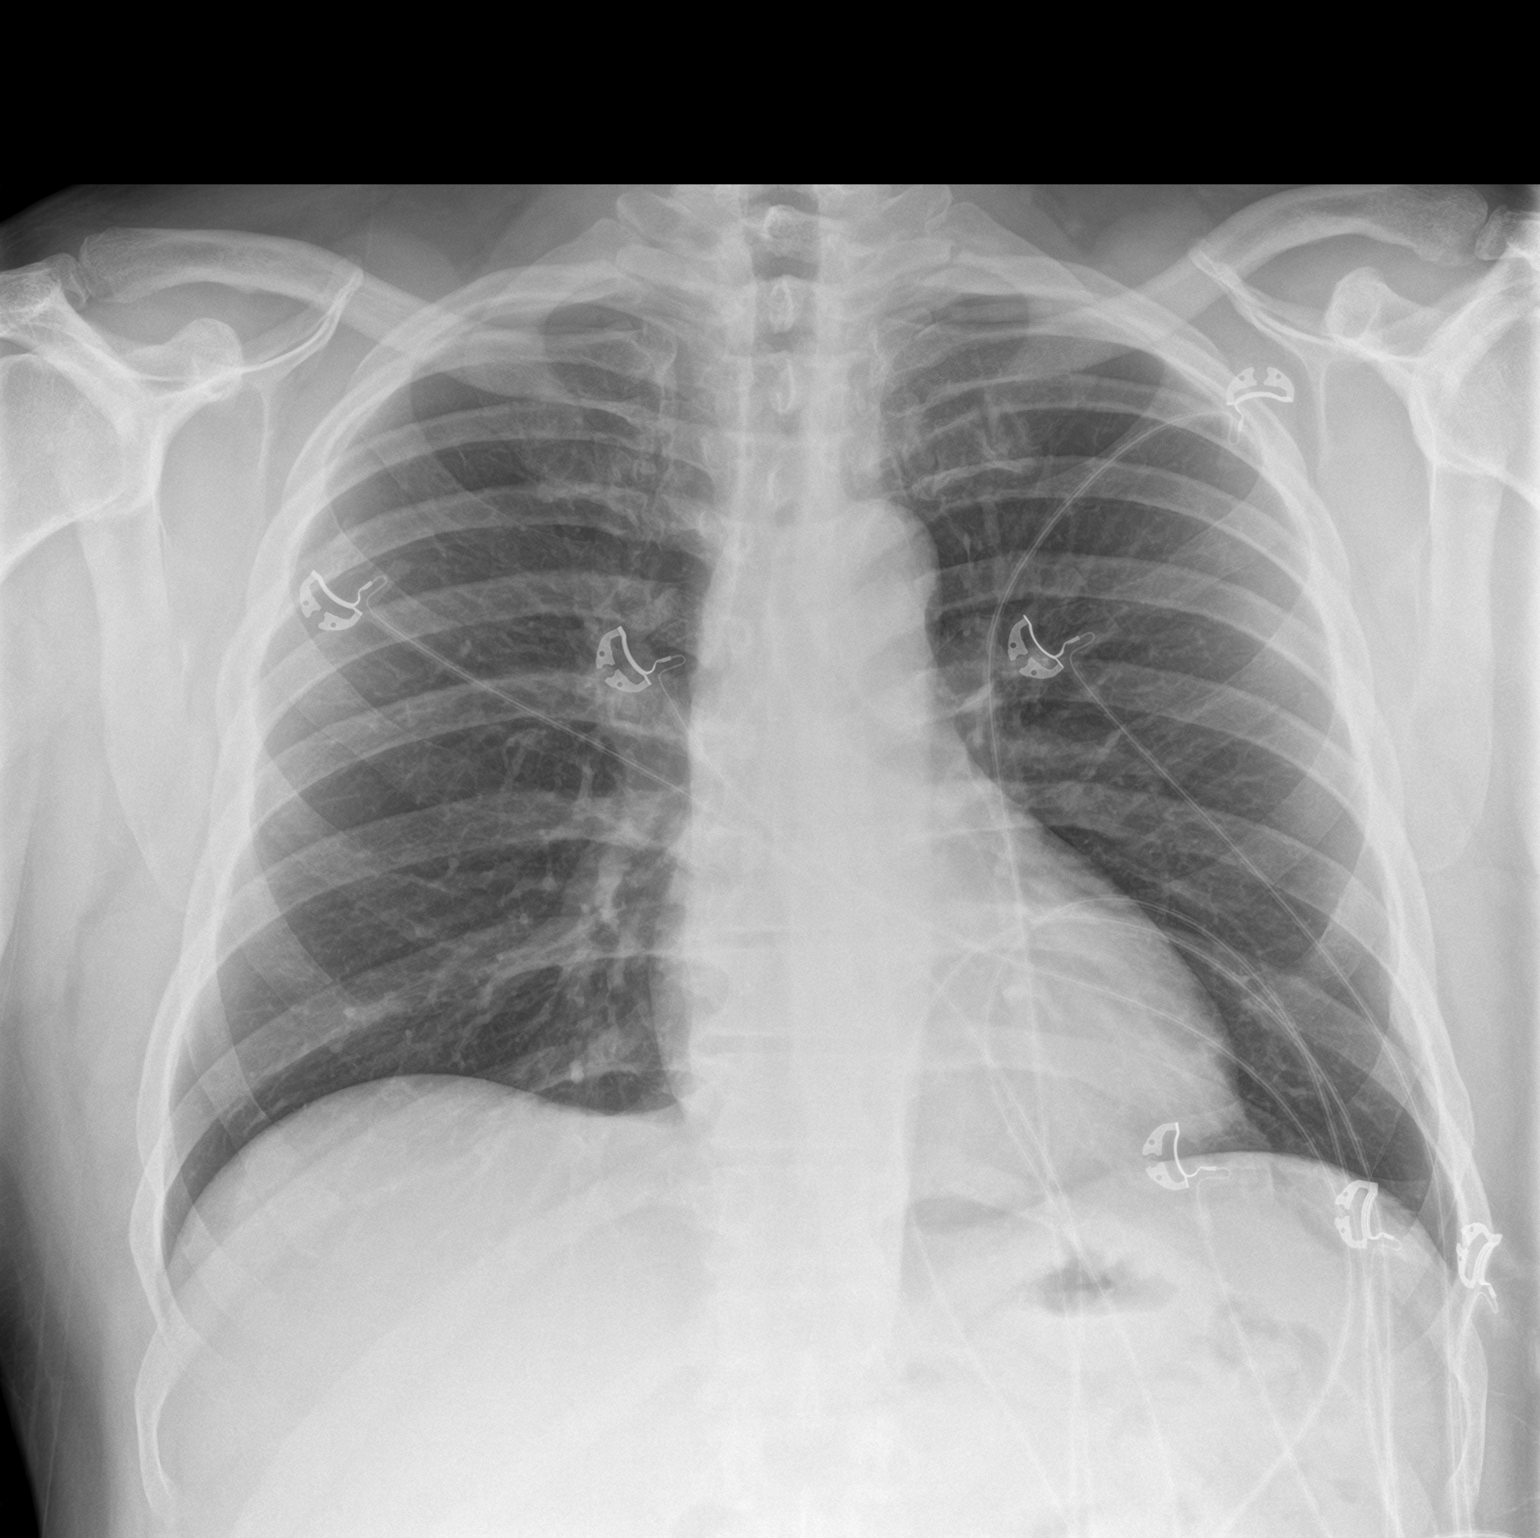

[chest lat]
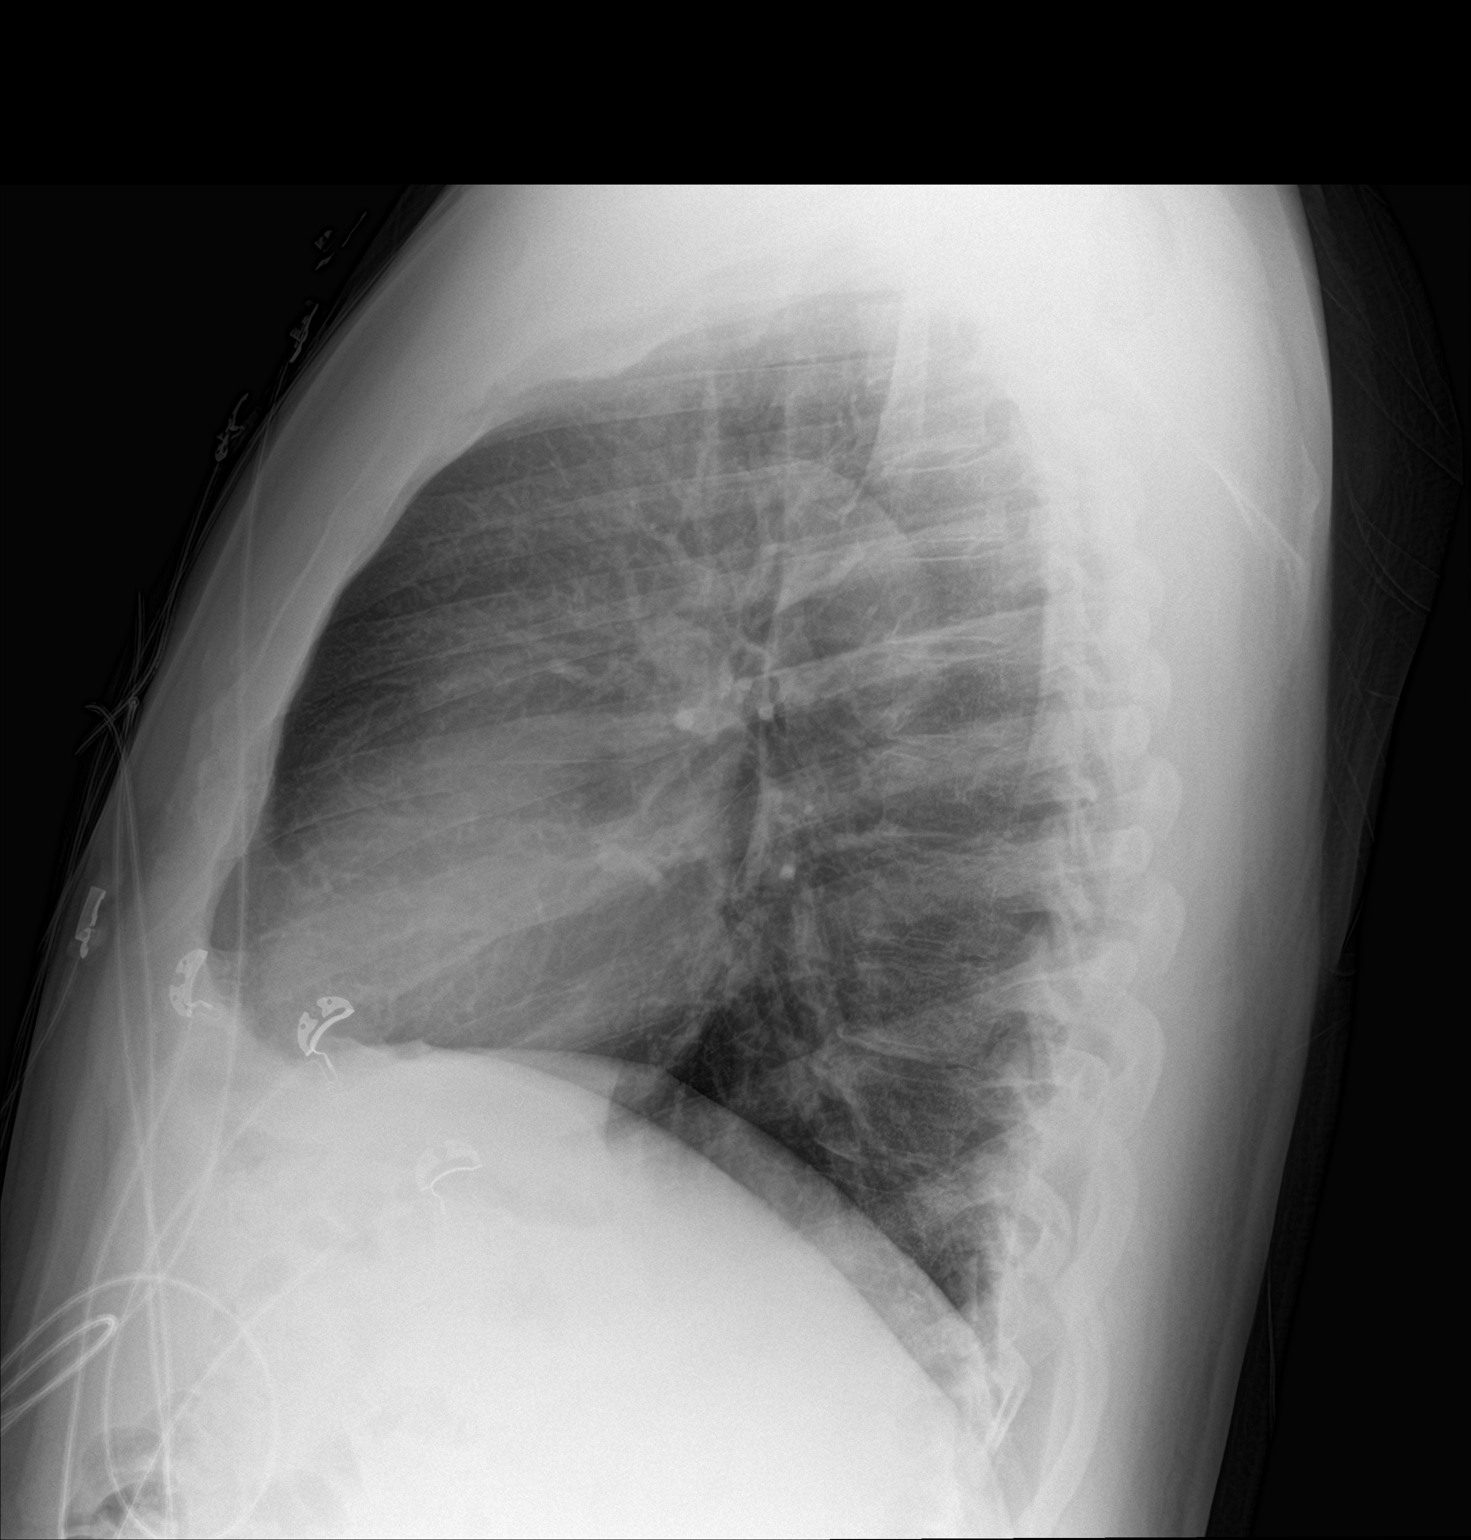

[2 of 2 positions shown; findings below may reference images not displayed]

FINDINGS: Normal heart, mediastinum and hila.

Clear lungs.  No pleural effusion or pneumothorax.

Skeletal structures are unremarkable.
IMPRESSION: No active cardiopulmonary disease.

## 2022-10-11 ENCOUNTER — Encounter: Payer: Self-pay | Admitting: *Deleted

## 2023-03-24 ENCOUNTER — Encounter: Payer: Self-pay | Admitting: *Deleted

## 2023-06-26 ENCOUNTER — Encounter: Payer: Self-pay | Admitting: *Deleted

## 2023-07-07 ENCOUNTER — Telehealth: Payer: Self-pay | Admitting: Internal Medicine

## 2023-07-07 NOTE — Telephone Encounter (Signed)
Questionnaire in review

## 2023-07-10 ENCOUNTER — Telehealth: Payer: Self-pay | Admitting: *Deleted

## 2023-07-10 NOTE — Telephone Encounter (Signed)
  Procedure: colonoscopy  Height: 6' Weight: 221 lb      BMI: 30.0  Have you had a colonoscopy before?  no  Do you have family history of colon cancer?  no  Do you have a family history of polyps? no  Previous colonoscopy with polyps removed? no  Do you have a history colorectal cancer?   no  Are you diabetic?  no  Do you have a prosthetic or mechanical heart valve? no  Do you have a pacemaker/defibrillator?   no  Have you had endocarditis/atrial fibrillation?  no  Do you use supplemental oxygen/CPAP?  no  Have you had joint replacement within the last 12 months?  no  Do you tend to be constipated or have to use laxatives?  no   Do you have history of alcohol use? If yes, how much and how often.  Yes, beer  Do you have history or are you using drugs? If yes, what do are you  using?  no  Have you ever had a stroke/heart attack?  no  Have you ever had a heart or other vascular stent placed,?no  Do you take weight loss medication? no  Do you take any blood-thinning medications such as: (Plavix, aspirin, Coumadin, Aggrenox, Brilinta, Xarelto, Eliquis, Pradaxa, Savaysa or Effient)? Aspirin   If yes we need the name, milligram, dosage and who is prescribing doctor:  81 mg             Current Outpatient Medications  Medication Sig Dispense Refill   amLODipine (NORVASC) 10 MG tablet Take 10 mg by mouth daily. 1/2 tablet daily     OLMESARTAN MEDOXOMIL PO Take 40 mg by mouth daily.     No current facility-administered medications for this visit.    No Known Allergies

## 2023-07-23 NOTE — Telephone Encounter (Signed)
Ok to schedule. ASA 2.  

## 2023-07-24 NOTE — Telephone Encounter (Signed)
LMOVM to call back 

## 2023-08-11 NOTE — Telephone Encounter (Signed)
LMOVM to call back. Letter also mailed 

## 2023-09-25 NOTE — Telephone Encounter (Signed)
 Pt is unable to schedule at this time due to change in his insurance. He will call back once he gets his new insurance.

## 2024-03-31 ENCOUNTER — Encounter: Payer: Self-pay | Admitting: *Deleted

## 2024-04-06 ENCOUNTER — Telehealth: Payer: Self-pay

## 2024-04-06 NOTE — Progress Notes (Unsigned)
 Complex Care Management Care Guide Note  04/06/2024 Name: Kurt Fowler MRN: [id number] DOB: Dec 18, 1970  Kurt Fowler is a 53 y.o. year old male who is a primary care patient of Marvine Rush, MD and is actively engaged with the care management team. I reached out to Christopher Pepper by phone today to assist with re-scheduling  with the RN Case Manager.  Follow up plan: Unsuccessful telephone outreach attempt made. A HIPAA compliant phone message was left for the patient providing contact information and requesting a return call.   Leotis Rase [facility], [facility] Guide  Direct Dial: [phone]  Fax [phone]

## 2024-04-08 NOTE — Progress Notes (Signed)
 Complex Care Management Note Care Guide Note  04/08/2024 Name: Kurt Fowler MRN: [id number] DOB: 01-08-1971   Complex Care Management Outreach Attempts: A second unsuccessful outreach was attempted today to offer the patient with information about available complex care management services.  Follow Up Plan:  Additional outreach attempts will be made to offer the patient complex care management information and services.   Encounter Outcome:  No Answer  Leotis Rase [facility], [facility] Guide  Direct Dial: [phone]  Fax [phone]

## 2024-05-10 ENCOUNTER — Encounter: Payer: Self-pay | Admitting: *Deleted
